# Patient Record
Sex: Male | Born: 2000 | Race: White | Hispanic: No | Marital: Single | Smoking: Never smoker
Health system: Southern US, Community
[De-identification: ages and names within clinical notes are randomized; demographics above are authoritative.]

---
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
## 2021-07-15 ENCOUNTER — Ambulatory Visit (INDEPENDENT_AMBULATORY_CARE_PROVIDER_SITE_OTHER): Payer: Medicaid Other | Admitting: Podiatry

## 2021-07-15 ENCOUNTER — Encounter: Payer: Self-pay | Admitting: Podiatry

## 2021-07-15 DIAGNOSIS — L600 Ingrowing nail: Secondary | ICD-10-CM

## 2021-07-15 NOTE — Patient Instructions (Addendum)

## 2021-07-15 NOTE — Progress Notes (Signed)
?  Subjective:  ?Patient ID: Luke Gonzalez., male    DOB: May 28, 2000,   MRN: [id number] ? ?Chief Complaint  ?Patient presents with  ? Nail Problem  ?  Hallux nails bilateral - thick and discolored nails x years, may have injured in the past, tender around the nail, no treatment  ? New Patient (Initial Visit)  ? ? ?21 y.o. male presents for concern of bilateral hallux nail ingrown's. Relates they are discolored and painful and have been that way for years.  Denies any treatments . Denies any other pedal complaints. Denies n/v/f/c.  ? ?History reviewed. No pertinent past medical history. ? ?Objective:  ?Physical Exam: ?Vascular: DP/PT pulses 2/4 bilateral. CFT <3 seconds. Normal hair growth on digits. No edema.  ?Skin. No lacerations or abrasions bilateral feet. Bilateral hallux nails are incurvated at borders and thickened and discolored. Tender to bilateral borders. Left fifth digit also loose and discolored ?Musculoskeletal: MMT 5/5 bilateral lower extremities in DF, PF, Inversion and Eversion. Deceased ROM in DF of ankle joint.  ?Neurological: Sensation intact to light touch.  ? ?Assessment:  ? ?1. Ingrown right greater toenail   ?2. Ingrown left greater toenail   ? ? ? ?Plan:  ?Patient was evaluated and treated and all questions answered. ?Patient requesting removal of ingrown nail today. Procedure below.  ?Discussed procedure and post procedure care and patient expressed understanding.  ?Left fifth digit was debrided back as courtesy to attached nail plate.  ?Will follow-up in 2 weeks for nail check or sooner if any problems arise.  ? ? ?Procedure:  ?Procedure: total Nail Avulsion of bilaterally hallux nail ?Surgeon: Lorenda Peck, DPM  ?Pre-op Dx: Ingrown toenail without infection ?Post-op: Same  ?Place of Surgery: Office exam room.  ?Indications for surgery: Painful and ingrown toenail.  ? ? ?The patient is requesting removal of nail without chemical matrixectomy. Risks and complications were  discussed with the patient for which they understand and written consent was obtained. Under sterile conditions a total of 3 mL of  1% lidocaine plain was infiltrated in a hallux block fashion. Once anesthetized, the skin was prepped in sterile fashion. A tourniquet was then applied. Next the entire bilateral hallux nails were removed in total and the area copiously irrigated. Silvadene was applied. A dry sterile dressing was applied. After application of the dressing the tourniquet was removed and there is found to be an immediate capillary refill time to the digit. The patient tolerated the procedure well without any complications. Post procedure instructions were discussed the patient for which he verbally understood. Follow-up in two weeks for nail check or sooner if any problems are to arise. Discussed signs/symptoms of infection and directed to call the office immediately should any occur or go directly to the emergency room. In the meantime, encouraged to call the office with any questions, concerns, changes symptoms. ? ? ?Lorenda Peck, DPM  ? ? ?

## 2021-07-30 ENCOUNTER — Ambulatory Visit: Payer: Medicaid Other | Admitting: Podiatry

## 2022-01-10 IMAGING — DX DG FINGER THUMB 2+V*L*
3 series · 3 of 3 positions shown · non-contrast
Comparison: None.

CLINICAL DATA: Pain following crush type injury

EXAM:
LEFT THUMB 2+V

[finger pa]
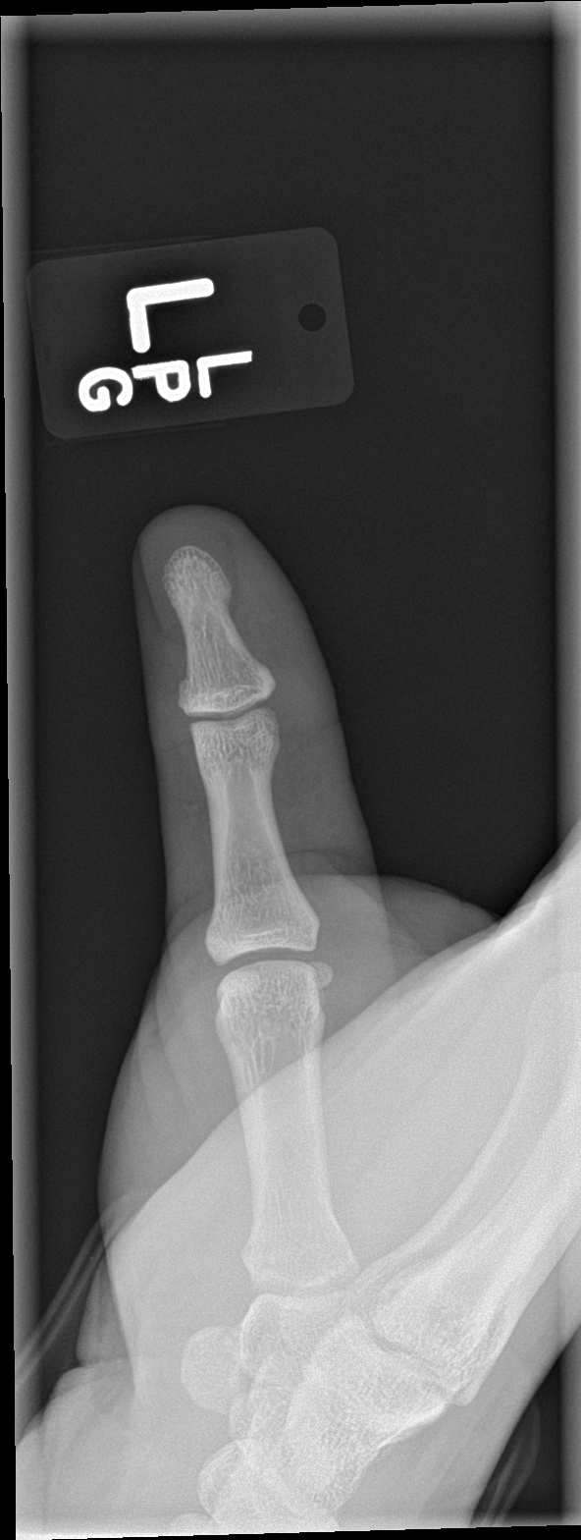

[finger obl]
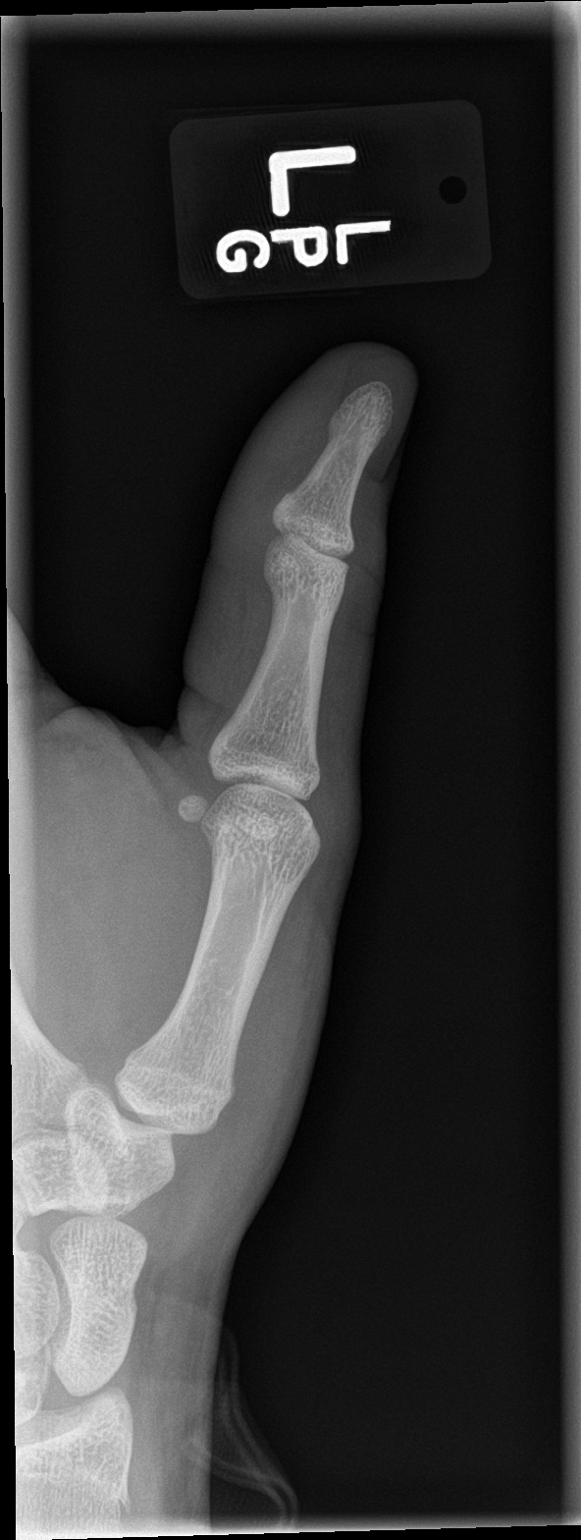

[finger lat]
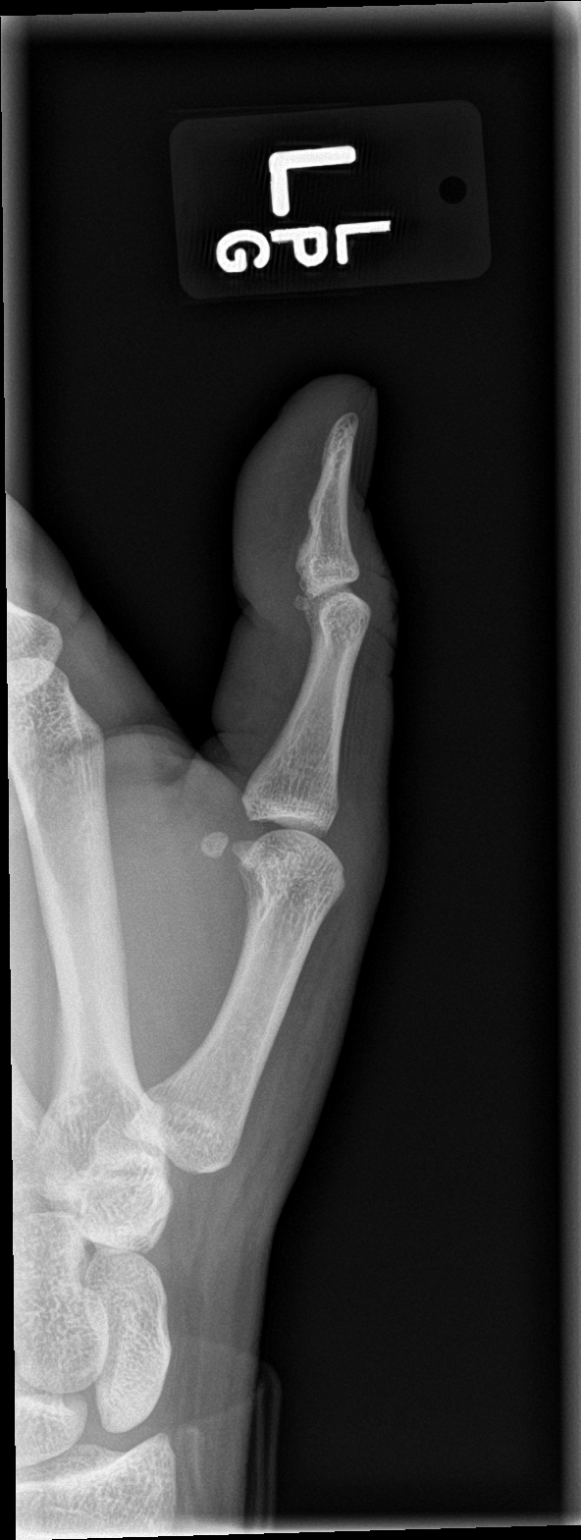

[3 of 3 positions shown; findings below may reference images not displayed]

FINDINGS: Frontal, oblique, and lateral views were obtained. No fracture or
dislocation. Joint spaces appear normal. No erosive change. No soft
tissue air or radiopaque foreign body.
IMPRESSION: No fracture or dislocation.  No appreciable arthropathy.
